# Patient Record
Sex: Male | Born: 1990 | Race: Black or African American | Hispanic: No | Marital: Single | Smoking: Current every day smoker
Health system: Southern US, Community
[De-identification: ages and names within clinical notes are randomized; demographics above are authoritative.]

## PROBLEM LIST (undated history)

## (undated) ENCOUNTER — Emergency Department (HOSPITAL_COMMUNITY): Admission: EM | Payer: No Typology Code available for payment source | Source: Home / Self Care

## (undated) ENCOUNTER — Ambulatory Visit (HOSPITAL_COMMUNITY): Admission: EM | Discharge status: Absent without leave | Payer: No Typology Code available for payment source

## (undated) DIAGNOSIS — IMO0001 Reserved for inherently not codable concepts without codable children: Secondary | ICD-10-CM

## (undated) DIAGNOSIS — J45909 Unspecified asthma, uncomplicated: Secondary | ICD-10-CM

---
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
## 2009-09-13 ENCOUNTER — Emergency Department (HOSPITAL_COMMUNITY): Admission: EM | Admit: 2009-09-13 | Discharge: 2009-09-13 | Payer: Self-pay | Admitting: Emergency Medicine

## 2010-03-16 ENCOUNTER — Emergency Department (HOSPITAL_COMMUNITY): Admission: EM | Admit: 2010-03-16 | Discharge: 2010-03-17 | Payer: Self-pay | Admitting: Emergency Medicine

## 2011-11-05 ENCOUNTER — Encounter (HOSPITAL_COMMUNITY): Payer: Self-pay | Admitting: Emergency Medicine

## 2011-11-05 ENCOUNTER — Emergency Department (HOSPITAL_COMMUNITY)
Admission: EM | Admit: 2011-11-05 | Discharge: 2011-11-06 | Disposition: A | Discharge status: Home / Self Care | Payer: Self-pay | Attending: Emergency Medicine | Admitting: Emergency Medicine

## 2011-11-05 DIAGNOSIS — M542 Cervicalgia: Secondary | ICD-10-CM | POA: Insufficient documentation

## 2011-11-05 DIAGNOSIS — M79605 Pain in left leg: Secondary | ICD-10-CM

## 2011-11-05 DIAGNOSIS — Z79899 Other long term (current) drug therapy: Secondary | ICD-10-CM | POA: Insufficient documentation

## 2011-11-05 DIAGNOSIS — R51 Headache: Secondary | ICD-10-CM | POA: Insufficient documentation

## 2011-11-05 DIAGNOSIS — M79609 Pain in unspecified limb: Secondary | ICD-10-CM | POA: Insufficient documentation

## 2011-11-05 DIAGNOSIS — IMO0001 Reserved for inherently not codable concepts without codable children: Secondary | ICD-10-CM | POA: Insufficient documentation

## 2011-11-05 HISTORY — DX: Reserved for inherently not codable concepts without codable children: IMO0001

## 2011-11-05 MED ORDER — IBUPROFEN 800 MG PO TABS
800.0000 mg | ORAL_TABLET | Freq: Once | ORAL | Status: AC
  Administered 2011-11-05: 800 mg via ORAL

## 2011-11-05 MED ORDER — OXYCODONE-ACETAMINOPHEN 5-325 MG PO TABS
ORAL_TABLET | ORAL | Status: AC
  Filled 2011-11-05: qty 2

## 2011-11-05 MED ORDER — IBUPROFEN 800 MG PO TABS
ORAL_TABLET | ORAL | Status: AC
  Filled 2011-11-05: qty 1

## 2011-11-05 MED ORDER — OXYCODONE-ACETAMINOPHEN 5-325 MG PO TABS
2.0000 | ORAL_TABLET | Freq: Once | ORAL | Status: AC
  Administered 2011-11-05: 2 via ORAL
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
## 2011-11-06 ENCOUNTER — Emergency Department (HOSPITAL_COMMUNITY): Payer: Self-pay

## 2011-11-06 MED ORDER — OXYCODONE-ACETAMINOPHEN 5-325 MG PO TABS: 2.0000 | ORAL_TABLET | ORAL | Status: AC | PRN

## 2011-11-06 MED ORDER — IBUPROFEN 800 MG PO TABS: 800.0000 mg | ORAL_TABLET | Freq: Three times a day (TID) | ORAL | Status: AC

## 2011-11-06 MED ORDER — DIAZEPAM 5 MG PO TABS: 5.0000 mg | ORAL_TABLET | Freq: Three times a day (TID) | ORAL | Status: AC | PRN

## 2011-11-06 NOTE — ED Notes (Signed)
Rx x 3, pt voiced understanding to f/u with [facility] in 3-5 days.
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
## 2014-10-15 ENCOUNTER — Emergency Department (HOSPITAL_COMMUNITY)
Admission: EM | Admit: 2014-10-15 | Discharge: 2014-10-15 | Disposition: A | Discharge status: Home / Self Care | Payer: Self-pay | Attending: Emergency Medicine | Admitting: Emergency Medicine

## 2014-10-15 ENCOUNTER — Emergency Department (HOSPITAL_COMMUNITY): Payer: Self-pay

## 2014-10-15 DIAGNOSIS — Y998 Other external cause status: Secondary | ICD-10-CM | POA: Insufficient documentation

## 2014-10-15 DIAGNOSIS — Y9389 Activity, other specified: Secondary | ICD-10-CM | POA: Insufficient documentation

## 2014-10-15 DIAGNOSIS — S8992XA Unspecified injury of left lower leg, initial encounter: Secondary | ICD-10-CM | POA: Insufficient documentation

## 2014-10-15 DIAGNOSIS — Y92410 Unspecified street and highway as the place of occurrence of the external cause: Secondary | ICD-10-CM | POA: Insufficient documentation

## 2014-10-15 DIAGNOSIS — M25569 Pain in unspecified knee: Secondary | ICD-10-CM

## 2014-10-15 MED ORDER — IBUPROFEN 800 MG PO TABS
800.0000 mg | ORAL_TABLET | Freq: Once | ORAL | Status: AC
  Administered 2014-10-15: 800 mg via ORAL
  Filled 2014-10-15: qty 1
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
## 2014-10-15 NOTE — Discharge Instructions (Signed)
Your x-ray was negative for acute fracture. You may follow-up with orthopedics if continue having problems/pain. Take tylenol or motrin as needed for pain. Return here for new concerns.
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
## 2014-10-17 ENCOUNTER — Emergency Department (HOSPITAL_COMMUNITY): Payer: Self-pay

## 2014-10-17 ENCOUNTER — Encounter (HOSPITAL_COMMUNITY): Payer: Self-pay | Admitting: Emergency Medicine

## 2014-10-17 ENCOUNTER — Emergency Department (HOSPITAL_COMMUNITY)
Admission: EM | Admit: 2014-10-17 | Discharge: 2014-10-17 | Disposition: A | Discharge status: Home / Self Care | Payer: Self-pay | Attending: Emergency Medicine | Admitting: Emergency Medicine

## 2014-10-17 DIAGNOSIS — Y92410 Unspecified street and highway as the place of occurrence of the external cause: Secondary | ICD-10-CM | POA: Insufficient documentation

## 2014-10-17 DIAGNOSIS — S8992XA Unspecified injury of left lower leg, initial encounter: Secondary | ICD-10-CM | POA: Insufficient documentation

## 2014-10-17 DIAGNOSIS — R52 Pain, unspecified: Secondary | ICD-10-CM

## 2014-10-17 DIAGNOSIS — Z720 Tobacco use: Secondary | ICD-10-CM | POA: Insufficient documentation

## 2014-10-17 DIAGNOSIS — M7918 Myalgia, other site: Secondary | ICD-10-CM

## 2014-10-17 DIAGNOSIS — Y9389 Activity, other specified: Secondary | ICD-10-CM | POA: Insufficient documentation

## 2014-10-17 DIAGNOSIS — Y998 Other external cause status: Secondary | ICD-10-CM | POA: Insufficient documentation

## 2014-10-17 DIAGNOSIS — S199XXA Unspecified injury of neck, initial encounter: Secondary | ICD-10-CM | POA: Insufficient documentation

## 2014-10-17 MED ORDER — METHOCARBAMOL 500 MG PO TABS: 1000.0000 mg | ORAL_TABLET | Freq: Four times a day (QID) | ORAL | Status: DC | PRN

## 2014-10-17 MED ORDER — METHOCARBAMOL 500 MG PO TABS
1000.0000 mg | ORAL_TABLET | Freq: Once | ORAL | Status: AC
  Administered 2014-10-17: 1000 mg via ORAL
  Filled 2014-10-17: qty 2

## 2014-10-17 NOTE — Discharge Instructions (Signed)
Rest, Ice intermittently (in the first 24-48 hours), Gentle compression with an Ace wrap, and elevate (Limb above the level of the heart)   For pain control you may take up to 800mg  of Motrin (also known as ibuprofen). That is usually 4 over the counter pills,  3 times a day. Take with food to minimize stomach irritation   You can also take  tylenol (acetaminophen) 975mg  (this is 3 over the counter pills) four times a day. Do not drink alcohol or combine with other medications that have acetaminophen as an ingredient (Read the labels!).    For breakthrough pain you may take Robaxin. Do not drink alcohol, drive or operate heavy machinery when taking Robaxin.
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
## 2017-07-12 ENCOUNTER — Encounter (HOSPITAL_COMMUNITY): Payer: Self-pay | Admitting: Nurse Practitioner

## 2017-07-12 ENCOUNTER — Emergency Department (HOSPITAL_COMMUNITY)
Admission: EM | Admit: 2017-07-12 | Discharge: 2017-07-12 | Disposition: A | Discharge status: Home / Self Care | Payer: No Typology Code available for payment source | Attending: Emergency Medicine | Admitting: Emergency Medicine

## 2017-07-12 ENCOUNTER — Other Ambulatory Visit: Payer: Self-pay

## 2017-07-12 ENCOUNTER — Emergency Department (HOSPITAL_COMMUNITY): Payer: No Typology Code available for payment source

## 2017-07-12 DIAGNOSIS — R51 Headache: Secondary | ICD-10-CM | POA: Insufficient documentation

## 2017-07-12 DIAGNOSIS — R1110 Vomiting, unspecified: Secondary | ICD-10-CM | POA: Diagnosis not present

## 2017-07-12 DIAGNOSIS — J45909 Unspecified asthma, uncomplicated: Secondary | ICD-10-CM | POA: Insufficient documentation

## 2017-07-12 DIAGNOSIS — F17210 Nicotine dependence, cigarettes, uncomplicated: Secondary | ICD-10-CM | POA: Insufficient documentation

## 2017-07-12 DIAGNOSIS — M542 Cervicalgia: Secondary | ICD-10-CM | POA: Insufficient documentation

## 2017-07-12 HISTORY — DX: Unspecified asthma, uncomplicated: J45.909

## 2017-07-12 MED ORDER — NAPROXEN 500 MG PO TABS: 500.0000 mg | ORAL_TABLET | Freq: Two times a day (BID) | ORAL | 0 refills | Status: DC

## 2017-07-12 MED ORDER — NAPROXEN 250 MG PO TABS
500.0000 mg | ORAL_TABLET | Freq: Once | ORAL | Status: AC
  Administered 2017-07-12: 500 mg via ORAL
  Filled 2017-07-12: qty 2

## 2017-07-12 MED ORDER — METHOCARBAMOL 500 MG PO TABS: 500.0000 mg | ORAL_TABLET | Freq: Three times a day (TID) | ORAL | 0 refills | Status: DC | PRN
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
## 2017-07-12 NOTE — ED Notes (Signed)
To ct
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
## 2020-02-24 ENCOUNTER — Other Ambulatory Visit: Payer: Self-pay

## 2020-02-24 ENCOUNTER — Emergency Department (HOSPITAL_COMMUNITY)
Admission: EM | Admit: 2020-02-24 | Discharge: 2020-02-24 | Disposition: A | Discharge status: Home / Self Care | Payer: Self-pay | Attending: Emergency Medicine | Admitting: Emergency Medicine

## 2020-02-24 DIAGNOSIS — K068 Other specified disorders of gingiva and edentulous alveolar ridge: Secondary | ICD-10-CM | POA: Insufficient documentation

## 2020-02-24 DIAGNOSIS — J45909 Unspecified asthma, uncomplicated: Secondary | ICD-10-CM | POA: Insufficient documentation

## 2020-02-24 DIAGNOSIS — F17210 Nicotine dependence, cigarettes, uncomplicated: Secondary | ICD-10-CM | POA: Insufficient documentation

## 2020-02-24 DIAGNOSIS — K0889 Other specified disorders of teeth and supporting structures: Secondary | ICD-10-CM | POA: Insufficient documentation

## 2020-02-24 LAB — CBC
HCT: 50.9 % (ref 39.0–52.0)
Hemoglobin: 16 g/dL (ref 13.0–17.0)
MCH: 28.2 pg (ref 26.0–34.0)
MCHC: 31.4 g/dL (ref 30.0–36.0)
MCV: 89.6 fL (ref 80.0–100.0)
Platelets: 338 10*3/uL (ref 150–400)
RBC: 5.68 MIL/uL (ref 4.22–5.81)
RDW: 13.9 % (ref 11.5–15.5)
WBC: 12.6 10*3/uL — ABNORMAL HIGH (ref 4.0–10.5)
nRBC: 0 % (ref 0.0–0.2)

## 2020-02-24 NOTE — ED Triage Notes (Signed)
Pt reports his R lower gums began to bleed without injury at work two days ago. Now his gums bleed every time he brushes his teeth.

## 2020-02-24 NOTE — ED Notes (Signed)
Patient verbalizes understanding of discharge instructions. Opportunity for questioning and answers were provided. Armband removed by staff, pt discharged from ED. Pt. ambulatory and discharged home.  

## 2020-02-24 NOTE — ED Provider Notes (Signed)
[facility] EMERGENCY DEPARTMENT Provider Note   CSN: [id number] Arrival date & time: 02/24/20  1443     History Chief Complaint  Patient presents with  . Bleeding/Bruising    bleeding gums    Howard Jordan is a 29 y.o. male.  Howard Jordan is a 29 y.o. male with history of, who presents to the ED for evaluation of bleeding gums.  He states over the past few days he has noted that every time he brushes his teeth his gums bleed primarily over the right lower molars, he states that the other day while he was at work his gums began to spontaneously bleed in this area without any trauma or injury and he had not recently brushed his teeth.  His manager asked him to leave work and be evaluated before returning.  He states that his gums are tender after he brushes his teeth, he has never had issues with bleeding gums before.  Denies any other bruising or bleeding problems, and he has never been told he has a problem with clotting.  He has not seen a dentist in quite a while, denies history of dental infections but has had many cavities.  Denies any current bleeding from his gums.  He has not noticed any rashes over his skin.  No arthralgias.  No fevers or chills.  No other aggravating or alleviating factors.        Past Medical History:  Diagnosis Date  . Asthma   . No significant past medical history     Patient Active Problem List   Diagnosis Date Noted  . No significant past medical history     No past surgical history on file.     No family history on file.  Social History   Tobacco Use  . Smoking status: Current Every Day Smoker    Packs/day: 0.50    Types: Cigarettes  . Smokeless tobacco: Never Used  Substance Use Topics  . Alcohol use: Yes    Alcohol/week: 5.0 standard drinks    Types: 5 Cans of beer per week  . Drug use: Yes    Types: Marijuana    Comment: occasionally     Home Medications Prior to Admission medications   Medication Sig  Start Date End Date Taking? Authorizing Provider  methocarbamol (ROBAXIN) 500 MG tablet Take 1 tablet (500 mg total) by mouth every 8 (eight) hours as needed for muscle spasms. 07/12/17   Petrucelli, Samantha R, PA-C  naproxen (NAPROSYN) 500 MG tablet Take 1 tablet (500 mg total) by mouth 2 (two) times daily. 07/12/17   Petrucelli, Pleas Koch, PA-C    Allergies    Patient has no known allergies.  Review of Systems   Review of Systems  Constitutional: Negative for chills and fever.  HENT: Positive for dental problem.        Gums bleeding  Skin: Negative for color change, rash and wound.  Hematological: Does not bruise/bleed easily.    Physical Exam Updated Vital Signs BP (!) 144/81 (BP Location: Right Arm)   Pulse 67   Temp 98.6 F (37 C) (Oral)   Resp 16   SpO2 100%   Physical Exam Vitals and nursing note reviewed.  Constitutional:      General: He is not in acute distress.    Appearance: Normal appearance. He is well-developed. He is not ill-appearing or diaphoretic.  HENT:     Head: Normocephalic and atraumatic.     Mouth/Throat:  Mouth: Mucous membranes are moist.     Pharynx: Oropharynx is clear.     Comments: No bleeding over the gums, teeth with noted plaque buildup, and in poor dentition.  Gums over the right molar without evidence of abscess, but do appear to be somewhat inflamed and are receding compared to the left Eyes:     General:        Right eye: No discharge.        Left eye: No discharge.  Pulmonary:     Effort: Pulmonary effort is normal. No respiratory distress.  Skin:    General: Skin is warm and dry.     Comments: No purpura, petechia or rash noted.  Neurological:     Mental Status: He is alert and oriented to person, place, and time.     Coordination: Coordination normal.  Psychiatric:        Mood and Affect: Mood normal.        Behavior: Behavior normal.     ED Results / Procedures / Treatments   Labs (all labs ordered are listed, but  only abnormal results are displayed) Labs Reviewed  CBC - Abnormal; Notable for the following components:      Result Value   WBC 12.6 (*)    All other components within normal limits    EKG None  Radiology No results found.  Procedures Procedures (including critical care time)  Medications Ordered in ED Medications - No data to display  ED Course  I have reviewed the triage vital signs and the nursing notes.  Pertinent labs & imaging results that were available during my care of the patient were reviewed by me and considered in my medical decision making (see chart for details).    MDM Rules/Calculators/A&P                         29 year old male presents with bleeding and pain over his gums primarily over the right lower molars.  He states symptoms started few days ago when he noticed that after brushing his teeth his gums will begin to bleed, he states he has never had a problem with this before.  He states his gums have been intermittently tender after brushing his teeth.  He states that while he was at work his gums randomly started bleeding without injury, or toothbrushing and his boss told him that he needed to go home and get a doctor's note before returning to work.  Suspect bleeding is likely due to gingivitis and/or periodontal disease, patient does not have any other bleeding or bruising symptoms, no petechiae purpura.  Will check platelets, but if this is normal recommend using softer toothbrush, and seeing a dentist for further evaluation.   Patient's platelets are within normal limits.  I have asked him to use a soft bristle toothbrush, mouthwash to help with gingivitis and I have given him a referral and resources for dental follow-up.  Provided work note. At this time there does not appear to be any evidence of an acute emergency medical condition and the patient appears stable for discharge with appropriate outpatient follow up. Diagnosis was discussed with patient  who verbalizes understanding and is agreeable to discharge.     Final Clinical Impression(s) / ED Diagnoses Final diagnoses:  Bleeding gums    Rx / DC Orders ED Discharge Orders    None       Legrand Rams 02/24/20 2054    Hyacinth Meeker,  Arlys John, MD 02/29/20 1302

## 2020-02-24 NOTE — Discharge Instructions (Signed)
Your platelets are normal today. You should make sure you're using a soft bristle toothbrush and brushing gently, use a mouthwash twice daily to help with potential gingivitis or inflammation of your gums. You should follow-up with the dentist. If you do start having bleeding of your gums swish and spit with cold water to help with bleeding.

## 2020-09-02 ENCOUNTER — Ambulatory Visit (HOSPITAL_COMMUNITY)
Admission: EM | Admit: 2020-09-02 | Discharge: 2020-09-02 | Disposition: A | Discharge status: Home / Self Care | Payer: No Typology Code available for payment source | Attending: Family Medicine | Admitting: Family Medicine

## 2020-09-02 ENCOUNTER — Other Ambulatory Visit: Payer: Self-pay

## 2020-09-02 ENCOUNTER — Encounter (HOSPITAL_COMMUNITY): Payer: Self-pay

## 2020-09-02 DIAGNOSIS — M109 Gout, unspecified: Secondary | ICD-10-CM

## 2020-09-02 MED ORDER — INDOMETHACIN 50 MG PO CAPS: 50.0000 mg | ORAL_CAPSULE | Freq: Three times a day (TID) | ORAL | 0 refills | Status: DC

## 2020-09-02 NOTE — ED Triage Notes (Signed)
Pt presents with pain and swelling left big toe x 2 days. Denies injury.

## 2020-09-05 ENCOUNTER — Other Ambulatory Visit: Payer: Self-pay

## 2020-09-05 ENCOUNTER — Ambulatory Visit (HOSPITAL_COMMUNITY)
Admission: EM | Admit: 2020-09-05 | Discharge: 2020-09-05 | Disposition: A | Discharge status: Home / Self Care | Payer: No Typology Code available for payment source | Attending: Student | Admitting: Student

## 2020-09-05 ENCOUNTER — Encounter (HOSPITAL_COMMUNITY): Payer: Self-pay

## 2020-09-05 DIAGNOSIS — M10072 Idiopathic gout, left ankle and foot: Secondary | ICD-10-CM

## 2020-09-05 MED ORDER — METHYLPREDNISOLONE SODIUM SUCC 125 MG IJ SOLR
60.0000 mg | Freq: Once | INTRAMUSCULAR | Status: AC
  Administered 2020-09-05: 60 mg via INTRAMUSCULAR

## 2020-09-05 MED ORDER — METHYLPREDNISOLONE SODIUM SUCC 125 MG IJ SOLR
INTRAMUSCULAR | Status: AC
  Filled 2020-09-05: qty 2

## 2020-09-05 NOTE — ED Provider Notes (Signed)
  [facility]ARE [facility]ENTER   [id number] 09/02/20 Arrival Time: 1719  ASSESSMENT & PLAN:  1. Podagra     No indications for imaging. No sign of skin infection. Discussed gout.  Begin: Meds ordered this encounter  Medications  . indomethacin (INDOCIN) 50 MG capsule    Sig: Take 1 capsule (50 mg total) by mouth 3 (three) times daily with meals.    Dispense:  15 capsule    Refill:  0   Activities as tolerated.  Recommend:  Follow-up Information    [facility].   Specialty: Urgent Care Why: If worsening or failing to improve as anticipated. Contact information: [address] [place] [address] [phone]              Reviewed expectations re: course of current medical issues. Questions answered. Outlined signs and symptoms indicating need for more acute intervention. Patient verbalized understanding. After Visit Summary given.  SUBJECTIVE: History from: patient. Howard Jordan is a 30 y.o. male who reports persistent and significant pain of L great toe; grad onset; 2 d. "Everything I do is painful". Mild redness. No injury.  Social History   Substance and Sexual Activity  Alcohol Use Yes  . Alcohol/week: 24.0 standard drinks  . Types: 24 Cans of beer per week   Comment: 24 can a week.      OBJECTIVE:  Vitals:   09/02/20 1814  BP: 138/79  Pulse: 60  Resp: 18  Temp: 99 F (37.2 C)  TempSrc: Oral  SpO2: 98%    General appearance: alert; no distress HEENT: [place]; AT Neck: supple with FROM Resp: unlabored respirations Extremities: . LLE: warm with well perfused appearance; TTP over 1st MTP joint with mild swelling and redness and warmth; normal ROM with pain; normal distal cap refill and sensation Skin: warm and dry; no visible rashes Psychological: alert and cooperative; normal mood and affect   No Known Allergies  Past Medical History:  Diagnosis Date  . Asthma   . No significant past medical history     Social History   Socioeconomic History  . Marital status: Single    Spouse name: Not on file  . Number of children: Not on file  . Years of education: Not on file  . Highest education level: Not on file  Occupational History  . Not on file  Tobacco Use  . Smoking status: Current Every Day Smoker    Packs/day: 0.50    Types: Cigarettes  . Smokeless tobacco: Never Used  Substance and Sexual Activity  . Alcohol use: Yes    Alcohol/week: 24.0 standard drinks    Types: 24 Cans of beer per week    Comment: 24 can a week.   . Drug use: Yes    Frequency: 3.0 times per week    Types: Marijuana    Comment: occasionally   . Sexual activity: Yes  Other Topics Concern  . Not on file  Social History Narrative  . Not on file   Social Determinants of Health   Financial Resource Strain: Not on file  Food Insecurity: Not on file  Transportation Needs: Not on file  Physical Activity: Not on file  Stress: Not on file  Social Connections: Not on file   History reviewed. No pertinent family history. History reviewed. No pertinent surgical history.    Mardella Layman, MD 09/05/20 [phone]

## 2020-09-05 NOTE — Discharge Instructions (Addendum)
Today is 09/05/2020. Howard Jordan, DOB 07/09/1991. You are being treated for gout of the left great toe.  Start the prednisone- 40mg  (two pills) taken together in the morning for 5 days (3/13-17) . Also continue indomethacin 50mg  3x daily with meals for 2-3 more days (3/12-15).  We also administered an injection of methylprednisolone (solumedrol) today, which is a steroid.

## 2020-09-05 NOTE — ED Provider Notes (Signed)
[facility]    CSN: [id number] Arrival date & time: 09/05/20  1453      History   Chief Complaint Chief Complaint  Patient presents with  . Toe Pain    Left foot big     HPI Howard Jordan is a 30 y.o. male presenting with continued left great toe pain. History gout, last treated for this 09/02/2020 at our urgent care with indomethacin 3x daily. Notes minimal improvement in pain on this regimen. Denies new trauma or pain elsewhere. 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  HPI  Past Medical History:  Diagnosis Date  . Asthma   . No significant past medical history     Patient Active Problem List   Diagnosis Date Noted  . No significant past medical history     History reviewed. No pertinent surgical history.     Home Medications    Prior to Admission medications   Medication Sig Start Date End Date Taking? Authorizing Provider  indomethacin (INDOCIN) 50 MG capsule Take 1 capsule (50 mg total) by mouth 3 (three) times daily with meals. 09/02/20   Mardella Layman, MD  methocarbamol (ROBAXIN) 500 MG tablet Take 1 tablet (500 mg total) by mouth every 8 (eight) hours as needed for muscle spasms. 07/12/17   Petrucelli, Pleas Koch, PA-C    Family History History reviewed. No pertinent family history.  Social History Social History   Tobacco Use  . Smoking status: Current Every Day Smoker    Packs/day: 0.50    Types: Cigarettes  . Smokeless tobacco: Never Used  Substance Use Topics  . Alcohol use: Yes    Alcohol/week: 24.0 standard drinks    Types: 24 Cans of beer per week    Comment: 24 can a week.   . Drug use: Yes    Frequency: 3.0 times per week    Types: Marijuana    Comment: occasionally      Allergies   Patient has no known allergies.   Review of Systems Review of Systems  Musculoskeletal:       Left great toe pain  All other systems reviewed and are negative.    Physical Exam Triage Vital Signs ED Triage  Vitals  Enc Vitals Group     BP      Pulse      Resp      Temp      Temp src      SpO2      Weight      Height      Head Circumference      Peak Flow      Pain Score      Pain Loc      Pain Edu?      Excl. in GC?    No data found.  Updated Vital Signs BP (!) 137/91 (BP Location: Right Arm)   Pulse 61   Temp 98.2 F (36.8 C) (Oral)   Resp 16   SpO2 99%   Visual Acuity Right Eye Distance:   Left Eye Distance:   Bilateral Distance:    Right Eye Near:   Left Eye Near:    Bilateral Near:     Physical Exam Vitals reviewed.  Constitutional:      Appearance: Normal appearance.  HENT:     Head: Normocephalic and atraumatic.  Pulmonary:     Effort: Pulmonary effort is normal.  Musculoskeletal:     Comments: Left great toe  tender to palpation over MTP joint with mild swelling and redness.  No warmth.  ROM intact but with pain.Cap refil <2 seconds. Sensation intact.  Neurological:     General: No focal deficit present.     Mental Status: He is alert and oriented to person, place, and time.  Psychiatric:        Mood and Affect: Mood normal.        Behavior: Behavior normal.        Thought Content: Thought content normal.        Judgment: Judgment normal.      UC Treatments / Results  Labs (all labs ordered are listed, but only abnormal results are displayed) Labs Reviewed - No data to display  EKG   Radiology No results found.  Procedures Procedures (including critical care time)  Medications Ordered in UC Medications  methylPREDNISolone sodium succinate (SOLU-MEDROL) 125 mg/2 mL injection 60 mg (has no administration in time range)    Initial Impression / Assessment and Plan / UC Course  I have reviewed the triage vital signs and the nursing notes.  Pertinent labs & imaging results that were available during my care of the patient were reviewed by me and considered in my medical decision making (see chart for details).     This patient is a  30 year old male presenting with continued gout symptoms despite 3 days of indomethacin tid. Plan to treat with solumedrol today and prednisone PO starting tomorrow. He is not a diabetic. Return precautions discussed.   This chart was dictated using voice recognition software, Dragon. Despite the best efforts of this provider to proofread and correct errors, errors may still occur which can change documentation meaning.   Final Clinical Impressions(s) / UC Diagnoses   Final diagnoses:  Acute idiopathic gout involving toe of left foot     Discharge Instructions     Today is 09/05/2020. Howard Jordan, DOB 07/09/1991. You are being treated for gout of the left great toe.  Start the prednisone- 40mg  (two pills) taken together in the morning for 5 days (3/13-17) . Also continue indomethacin 50mg  3x daily with meals for 2-3 more days (3/12-15).  We also administered an injection of methylprednisolone (solumedrol) today, which is a steroid.     ED Prescriptions    None     PDMP not reviewed this encounter.   , PA-C 09/05/20 1547

## 2020-09-05 NOTE — ED Triage Notes (Signed)
Pt present Left foot/Big toe pain with some swelling. Pt states recently came in on 09/02/20 and  He is still limping with severe pain.

## 2021-02-25 ENCOUNTER — Emergency Department (HOSPITAL_BASED_OUTPATIENT_CLINIC_OR_DEPARTMENT_OTHER)
Admission: EM | Admit: 2021-02-25 | Discharge: 2021-02-25 | Disposition: A | Discharge status: Home / Self Care | Payer: No Typology Code available for payment source | Attending: Emergency Medicine | Admitting: Emergency Medicine

## 2021-02-25 ENCOUNTER — Other Ambulatory Visit: Payer: Self-pay

## 2021-02-25 ENCOUNTER — Encounter (HOSPITAL_BASED_OUTPATIENT_CLINIC_OR_DEPARTMENT_OTHER): Payer: Self-pay

## 2021-02-25 DIAGNOSIS — R030 Elevated blood-pressure reading, without diagnosis of hypertension: Secondary | ICD-10-CM | POA: Diagnosis not present

## 2021-02-25 DIAGNOSIS — F17210 Nicotine dependence, cigarettes, uncomplicated: Secondary | ICD-10-CM | POA: Insufficient documentation

## 2021-02-25 DIAGNOSIS — J45909 Unspecified asthma, uncomplicated: Secondary | ICD-10-CM | POA: Insufficient documentation

## 2021-02-25 DIAGNOSIS — K0889 Other specified disorders of teeth and supporting structures: Secondary | ICD-10-CM | POA: Insufficient documentation

## 2021-02-25 DIAGNOSIS — L03211 Cellulitis of face: Secondary | ICD-10-CM | POA: Diagnosis not present

## 2021-02-25 MED ORDER — HYDROCODONE-ACETAMINOPHEN 5-325 MG PO TABS: 1.0000 | ORAL_TABLET | ORAL | 0 refills | Status: DC | PRN

## 2021-02-25 MED ORDER — IBUPROFEN 800 MG PO TABS
800.0000 mg | ORAL_TABLET | Freq: Once | ORAL | Status: AC
  Administered 2021-02-25: 800 mg via ORAL
  Filled 2021-02-25: qty 1

## 2021-02-25 MED ORDER — AMOXICILLIN 500 MG PO CAPS
1000.0000 mg | ORAL_CAPSULE | Freq: Once | ORAL | Status: AC
  Administered 2021-02-25: 1000 mg via ORAL
  Filled 2021-02-25: qty 2

## 2021-02-25 MED ORDER — AMOXICILLIN 500 MG PO CAPS: 1000.0000 mg | ORAL_CAPSULE | Freq: Two times a day (BID) | ORAL | 0 refills | Status: DC

## 2021-02-25 NOTE — ED Provider Notes (Signed)
[facility] EMERGENCY DEPT Provider Note   CSN: [id number] Arrival date & time: 02/25/21  7867     History Chief Complaint  Patient presents with   Dental Pain   Facial Swelling    Howard Jordan is a 30 y.o. male.  The history is provided by the patient.  Dental Pain He has history of asthma and comes in complaining of pain and swelling on the right side of his face which started this morning.  He has a long history of dental pain but has not been able to see a dentist because of finances.  He denies fever or chills.  He has not tried anything for pain.   Past Medical History:  Diagnosis Date   Asthma    No significant past medical history     Patient Active Problem List   Diagnosis Date Noted   No significant past medical history     History reviewed. No pertinent surgical history.     No family history on file.  Social History   Tobacco Use   Smoking status: Every Day    Packs/day: 0.50    Types: Cigarettes   Smokeless tobacco: Never  Substance Use Topics   Alcohol use: Yes    Alcohol/week: 24.0 standard drinks    Types: 24 Cans of beer per week    Comment: 1 Beer/Day   Drug use: Yes    Frequency: 7.0 times per week    Types: Marijuana    Comment: Daily    Home Medications Prior to Admission medications   Medication Sig Start Date End Date Taking? Authorizing Provider  indomethacin (INDOCIN) 50 MG capsule Take 1 capsule (50 mg total) by mouth 3 (three) times daily with meals. 09/02/20   Mardella Layman, MD  methocarbamol (ROBAXIN) 500 MG tablet Take 1 tablet (500 mg total) by mouth every 8 (eight) hours as needed for muscle spasms. 07/12/17   Petrucelli, Pleas Koch, PA-C    Allergies    Patient has no known allergies.  Review of Systems   Review of Systems  All other systems reviewed and are negative.  Physical Exam Updated Vital Signs BP (!) 158/104 (BP Location: Right Arm)   Pulse 75   Temp 98.4 F (36.9 C) (Oral)   Resp 16    Ht 6' (1.829 m)   Wt 106.6 kg   SpO2 99%   BMI 31.87 kg/m   Physical Exam Vitals and nursing note reviewed.  30 year old male, resting comfortably and in no acute distress. Vital signs are significant for elevated blood pressure. Oxygen saturation is 99%, which is normal. Head is normocephalic and atraumatic. PERRLA, EOMI. there is soft tissue swelling of the right malar area with mild tenderness.  There is no erythema or fluctuance.  Examination of the oral cavity shows no obvious dental caries, no dental abscesses.  There is no tenderness to palpation of the gingiva and no tenderness to percussion on any of the teeth. Neck is nontender and supple without adenopathy or JVD. Back is nontender and there is no CVA tenderness. Lungs are clear without rales, wheezes, or rhonchi. Chest is nontender. Heart has regular rate and rhythm without murmur. Abdomen is soft, flat, nontender without masses or hepatosplenomegaly and peristalsis is normoactive. Extremities have no cyanosis or edema, full range of motion is present. Skin is warm and dry without rash. Neurologic: Mental status is normal, cranial nerves are intact, there are no motor or sensory deficits.  ED Results / Procedures /  Treatments   Labs  Procedures Procedures   Medications Ordered in ED Medications  amoxicillin (AMOXIL) capsule 1,000 mg (1,000 mg Oral Given 02/25/21 0202)  ibuprofen (ADVIL) tablet 800 mg (800 mg Oral Given 02/25/21 0202)    ED Course  I have reviewed the triage vital signs and the nursing notes.  MDM Rules/Calculators/A&P                         Facial cellulitis without clear evidence of abscess at this time.  Elevated blood pressure.  He is discharged with prescription for amoxicillin and also small number of hydrocodone-acetaminophen tablets.  He is referred to ENT and on-call dentist for follow-up.  Old records are reviewed, and he has no relevant past visits.  Final Clinical Impression(s) / ED  Diagnoses Final diagnoses:  Facial cellulitis  Elevated blood pressure reading without diagnosis of hypertension    Rx / DC Orders ED Discharge Orders          Ordered    amoxicillin (AMOXIL) 500 MG capsule  2 times daily        02/25/21 0209    HYDROcodone-acetaminophen (NORCO) 5-325 MG tablet  Every 4 hours PRN        02/25/21 0209             Dione Booze, MD 02/25/21 0210

## 2021-02-25 NOTE — Discharge Instructions (Addendum)
Apply warm compresses several times a day.  Take ibuprofen and/your acetaminophen as needed for pain.  Please note that combining the 2 medications gives better pain relief than either medication by itself.  Reserve hydrocodone-acetaminophen for severe pain..  Your blood sugar was high today.  It may have been high because you are in pain, but you also may have high blood pressure.  Please have your blood pressure rechecked several times in the next 2 weeks.  If it is staying high, you may need to be on medication to control it.  Inadequately controlled high blood pressure can lead to heart attacks, strokes, kidney failure.

## 2021-02-25 NOTE — ED Triage Notes (Signed)
Patient here POV from Home with Dental Pain.  Patient states he has been having Right Sided Dental Pain for approximately 5-10 years but never went to see Dentist due to Lack of Dental Insurance.  Today the Patient noticed Moderate Swelling to the Right Side of his Face.   Airway Intact. No Fevers at Home. A&Ox4. GCS 15. NAD Noted during Triage.

## 2022-06-22 ENCOUNTER — Ambulatory Visit (HOSPITAL_COMMUNITY)
Admission: EM | Admit: 2022-06-22 | Discharge: 2022-06-22 | Disposition: A | Discharge status: Home / Self Care | Payer: Self-pay | Attending: Urgent Care | Admitting: Urgent Care

## 2022-06-22 ENCOUNTER — Encounter (HOSPITAL_COMMUNITY): Payer: Self-pay

## 2022-06-22 DIAGNOSIS — M2142 Flat foot [pes planus] (acquired), left foot: Secondary | ICD-10-CM

## 2022-06-22 DIAGNOSIS — M2141 Flat foot [pes planus] (acquired), right foot: Secondary | ICD-10-CM

## 2022-06-22 DIAGNOSIS — M25572 Pain in left ankle and joints of left foot: Secondary | ICD-10-CM

## 2022-06-22 MED ORDER — DICLOFENAC SODIUM 75 MG PO TBEC: 75.0000 mg | DELAYED_RELEASE_TABLET | Freq: Two times a day (BID) | ORAL | 0 refills | Status: AC

## 2022-06-22 NOTE — Discharge Instructions (Addendum)
It is possible that you have a partial sprain to your deltoid ligament on your left ankle. Please take the diclofenac twice daily with food. Do not take any additional over-the-counter anti-inflammatory such as ibuprofen, Aleve, naproxen, Advil. Please perform the stretches attached to this handout.  For your flatfeet, please purchase Dr. Margart Sickles metatarsal arch supports.  This will help prevent foot issues in the future.

## 2022-06-22 NOTE — ED Triage Notes (Signed)
Pt is here for ankle pain since today causing pain and discomfort

## 2022-06-22 NOTE — ED Provider Notes (Signed)
[facility]    CSN: [id number] Arrival date & time: 06/22/22  1801      History   Chief Complaint Chief Complaint  Patient presents with   Ankle Pain    HPI Howard Jordan is a 31 y.o. male.   Pleasant 31 year old male presents today due to concerns of left ankle pain.  He states yesterday evening he started noticing a "twinge of pain", but states he woke up this morning with worse pain.  He states this morning he was unable to bear weight and it hurt to move his ankle.  He was unable to work.  He did take 2 over-the-counter ibuprofens midmorning, and states by mid afternoon the symptoms had improved, but are still there.  He does have a history of gout, but states that was always in his toe and this feels completely different.  He denies any injury or trauma.  He denies any swelling warmth or erythema.  Patient states the pain seems to be in 1 specific location to his medial left ankle around the deltoid ligament.   Ankle Pain   Past Medical History:  Diagnosis Date   Asthma    No significant past medical history     Patient Active Problem List   Diagnosis Date Noted   No significant past medical history     History reviewed. No pertinent surgical history.     Home Medications    Prior to Admission medications   Medication Sig Start Date End Date Taking? Authorizing Provider  diclofenac (VOLTAREN) 75 MG EC tablet Take 1 tablet (75 mg total) by mouth 2 (two) times daily with a meal. 06/22/22  Yes Aleila Syverson L, PA    Family History History reviewed. No pertinent family history.  Social History Social History   Tobacco Use   Smoking status: Every Day    Packs/day: 0.50    Types: Cigarettes   Smokeless tobacco: Never  Substance Use Topics   Alcohol use: Yes    Alcohol/week: 24.0 standard drinks of alcohol    Types: 24 Cans of beer per week    Comment: 1 Beer/Day   Drug use: Yes    Frequency: 7.0 times per week    Types: Marijuana     Comment: Daily     Allergies   Patient has no known allergies.   Review of Systems Review of Systems As per HPI  Physical Exam Triage Vital Signs ED Triage Vitals  Enc Vitals Group     BP 06/22/22 2005 124/87     Pulse Rate 06/22/22 2005 91     Resp 06/22/22 2005 12     Temp 06/22/22 2005 98.2 F (36.8 C)     Temp Source 06/22/22 2005 Oral     SpO2 06/22/22 2005 97 %     Weight --      Height --      Head Circumference --      Peak Flow --      Pain Score 06/22/22 2004 8     Pain Loc --      Pain Edu? --      Excl. in GC? --    No data found.  Updated Vital Signs BP 124/87 (BP Location: Left Arm)   Pulse 91   Temp 98.2 F (36.8 C) (Oral)   Resp 12   SpO2 97%   Visual Acuity Right Eye Distance:   Left Eye Distance:   Bilateral Distance:    Right Eye Near:  Left Eye Near:    Bilateral Near:     Physical Exam Vitals and nursing note reviewed.  Constitutional:      General: He is not in acute distress.    Appearance: Normal appearance. He is normal weight. He is not ill-appearing or toxic-appearing.  HENT:     Head: Normocephalic and atraumatic.  Cardiovascular:     Rate and Rhythm: Normal rate.  Pulmonary:     Effort: Pulmonary effort is normal. No respiratory distress.  Musculoskeletal:     Right ankle: Normal. No swelling, deformity, ecchymosis or lacerations. No tenderness. Normal range of motion. Anterior drawer test negative. Normal pulse.     Right Achilles Tendon: Normal. No tenderness or defects. Thompson's test negative.     Left ankle: Normal. No swelling, deformity, ecchymosis or lacerations. No tenderness. Normal range of motion. Anterior drawer test negative. Normal pulse.     Left Achilles Tendon: Normal. No tenderness or defects. Thompson's test negative.     Right foot: Normal. Normal range of motion and normal capillary refill. No swelling, deformity, tenderness, bony tenderness or crepitus. Normal pulse.     Left foot: Normal range  of motion and normal capillary refill. Tenderness (to a single point on L deltoid ligament) present. No swelling, deformity, bony tenderness or crepitus. Normal pulse.       Feet:     Comments: B pes planus  Skin:    General: Skin is warm and dry.     Capillary Refill: Capillary refill takes less than 2 seconds.     Coloration: Skin is not jaundiced.     Findings: No bruising, erythema or rash.  Neurological:     General: No focal deficit present.     Mental Status: He is alert and oriented to person, place, and time.     Sensory: No sensory deficit.     Motor: No weakness.      UC Treatments / Results  Labs (all labs ordered are listed, but only abnormal results are displayed) Labs Reviewed - No data to display  EKG   Radiology No results found.  Procedures Procedures (including critical care time)  Medications Ordered in UC Medications - No data to display  Initial Impression / Assessment and Plan / UC Course  I have reviewed the triage vital signs and the nursing notes.  Pertinent labs & imaging results that were available during my care of the patient were reviewed by me and considered in my medical decision making (see chart for details).     Pes planus -patient with severe pes planus.  Question if this is predisposing him to his ankle issue.  Recommended a shoe insert, follow-up with podiatry should symptoms persist. Acute left ankle pain -his pain is reproducible to palpation of 1 specific area of his deltoid ligament.  This is likely a mild sprain.  He did have improvement with ibuprofen.  We will discharge him home on diclofenac to take twice daily as needed with food.  Given absence of injury and lack of bony discomfort, imaging unnecessary.  Final Clinical Impressions(s) / UC Diagnoses   Final diagnoses:  Pes planus of both feet  Acute left ankle pain     Discharge Instructions      It is possible that you have a partial sprain to your deltoid  ligament on your left ankle. Please take the diclofenac twice daily with food. Do not take any additional over-the-counter anti-inflammatory such as ibuprofen, Aleve, naproxen, Advil. Please perform the  stretches attached to this handout.  For your flatfeet, please purchase Dr. Margart Sickles metatarsal arch supports.  This will help prevent foot issues in the future.    ED Prescriptions     Medication Sig Dispense Auth. Provider   diclofenac (VOLTAREN) 75 MG EC tablet Take 1 tablet (75 mg total) by mouth 2 (two) times daily with a meal. 20 tablet Aurielle Slingerland L, PA      PDMP not reviewed this encounter.   Maretta Bees, [place] 06/22/22 2154

## 2024-06-30 ENCOUNTER — Encounter (HOSPITAL_COMMUNITY): Payer: Self-pay

## 2024-06-30 ENCOUNTER — Other Ambulatory Visit: Payer: Self-pay

## 2024-06-30 ENCOUNTER — Emergency Department (HOSPITAL_COMMUNITY)
Admission: EM | Admit: 2024-06-30 | Discharge: 2024-06-30 | Disposition: A | Discharge status: Home / Self Care | Attending: Emergency Medicine | Admitting: Emergency Medicine

## 2024-06-30 DIAGNOSIS — K047 Periapical abscess without sinus: Secondary | ICD-10-CM | POA: Insufficient documentation

## 2024-06-30 DIAGNOSIS — K0889 Other specified disorders of teeth and supporting structures: Secondary | ICD-10-CM | POA: Diagnosis present

## 2024-06-30 MED ORDER — AMOXICILLIN-POT CLAVULANATE 875-125 MG PO TABS: 1.0000 | ORAL_TABLET | Freq: Two times a day (BID) | ORAL | 0 refills | Status: AC

## 2024-06-30 MED ORDER — AMOXICILLIN-POT CLAVULANATE 875-125 MG PO TABS
1.0000 | ORAL_TABLET | Freq: Once | ORAL | Status: AC
  Administered 2024-06-30: 1 via ORAL
  Filled 2024-06-30: qty 1

## 2024-06-30 MED ORDER — IBUPROFEN 400 MG PO TABS
600.0000 mg | ORAL_TABLET | Freq: Once | ORAL | Status: AC
  Administered 2024-06-30: 600 mg via ORAL
  Filled 2024-06-30: qty 1

## 2024-06-30 NOTE — ED Provider Notes (Signed)
 " [place] [facility] Provider Note   CSN: [id number] Arrival date & time: 06/30/24  1337     Patient presents with: Dental Pain and Facial Swelling   Howard Jordan is a 34 y.o. male with no significant past medical history presents with concern for right sided dental pain that started last night.  Also reports he has noticed some swelling to his right cheek that started yesterday.  Denies any difficulties with swallowing.  No recent dental work.  Denies fever or chills.  States he has not seen a dentist in many years.    Dental Pain Associated symptoms: no fever        Prior to Admission medications  Medication Sig Start Date End Date Taking? Authorizing Provider  amoxicillin -clavulanate (AUGMENTIN ) 875-125 MG tablet Take 1 tablet by mouth 2 (two) times daily. 06/30/24  Yes Veta Palma, PA-C  diclofenac  (VOLTAREN ) 75 MG EC tablet Take 1 tablet (75 mg total) by mouth 2 (two) times daily with a meal. 06/22/22   Crain, Whitney L, PA    Allergies: Patient has no known allergies.    Review of Systems  Constitutional:  Negative for fever.  HENT:  Positive for dental problem.     Updated Vital Signs BP (!) 142/90   Pulse 60   Temp 99 F (37.2 C)   Resp 18   SpO2 100%   Physical Exam Vitals and nursing note reviewed.  Constitutional:      Appearance: Normal appearance.  HENT:     Head: Atraumatic.     Mouth/Throat:     Comments: Able to open mouth fully, no trismus.  Uvula midline.  Very poor dentition throughout the oral cavity and multiple dental caries noted.  No peritonsillar abscess.  No dental abscesses noted.  No tonsillar exudate  No edema or erythema of the sublingual space.  Swelling to the soft tissue overlying the right cheek.  No erythema.   Submandibular lymphadenopathy bilaterally Neck:     Comments: Neck is soft and supple, no overlying skin change. Cardiovascular:     Rate and Rhythm: Normal rate and regular  rhythm.  Pulmonary:     Effort: Pulmonary effort is normal.  Musculoskeletal:     Cervical back: Normal range of motion. No rigidity.  Neurological:     General: No focal deficit present.     Mental Status: He is alert.  Psychiatric:        Mood and Affect: Mood normal.        Behavior: Behavior normal.     (all labs ordered are listed, but only abnormal results are displayed) Labs Reviewed - No data to display  EKG: None  Radiology: No results found.   Procedures   Medications Ordered in the ED  amoxicillin -clavulanate (AUGMENTIN ) 875-125 MG per tablet 1 tablet (1 tablet Oral Given 06/30/24 1416)  ibuprofen  (ADVIL ) tablet 600 mg (600 mg Oral Given 06/30/24 1416)                                    Medical Decision Making    Differential diagnosis includes but is not limited to dental abscess or infection, ludwig angina, osteomyelitis, peritonsillar abscess, gingivitis, dental carries, sialadenitis   ED Course:  Upon initial evaluation, patient is well appearing in no acute distress.  Stable vitals aside from slightly elevated blood pressure 142/90.  Afebrile.  No tachycardia. Patient able  to open mouth fully with no trismus. Swallowing secretions without difficulty. Uvula midline. No visualized peritonsillar abscess or dental abscess. Neck is soft and supple without overlying skin change, no edema of the submandibular space or tongue, low concern for ludwig angina.  Low concern for deep space infection.  Patient does have soft tissue edema over the right cheek.  No erythema.  He has poor dentition throughout the oral cavity with multiple dental caries noted.  No obvious source of infection noted, but given the poor dentition and sudden onset of swelling and pain, will treat for possible dental infection with course of Augmentin .  Patient stable and appropriate for discharge home at this time  Medications Given: Augmentin  Ibuprofen   Impression: Dental pain Dental  infection  Disposition:  The patient was discharged home with instructions to take tylenol  and ibuprofen  as needed for pain.  Take 7-day course of Augmentin  as prescribed.  Follow up with their dentist within the next 3 days for recheck of symptoms and further routine care.  List of local dental offices was provided for patient if he does not currently have dentist Return precautions given and patient verbalized understanding.  This chart was dictated using voice recognition software, Dragon. Despite the best efforts of this provider to proofread and correct errors, errors may still occur which can change documentation meaning.       Final diagnoses:  Dental infection  Pain, dental    ED Discharge Orders          Ordered    amoxicillin -clavulanate (AUGMENTIN ) 875-125 MG tablet  2 times daily        06/30/24 1433               Veta Palma, PA-C 06/30/24 1438    Melvenia Motto, MD 06/30/24 2145  "

## 2024-06-30 NOTE — Discharge Instructions (Signed)
 You have been seen today for your dental pain  Medications: You may use up to 600mg  ibuprofen  every 6 hours as needed for pain.  Do not exceed 2.4g of ibuprofen  per day.  You were given your first dose here today, next dose will be at 8:15 PM.   You may take up to 1000mg  of tylenol  every 6 hours as needed for pain. Do not take more then 4g per day.   You have been prescribed an antibiotic called Augmentin  (amoxicillin -clavulanate). Take this antibiotic 2 times a day for the next 7 days.  You were given your first dose here today.  You can take your next dose this evening with dinner.  Take the full course of your antibiotic even if you start feeling better. Antibiotics may cause you to have diarrhea.  Follow up: Please make an appointment with your dentist within the next 2-3 days for repeat evaluation and further management.   If you do not have a dentist, you can establish care with one of the offices below: [facility] dental clinic -  [address] [place] - [place] [address] [phone]  [facility] clinic) treats patients who are in the following groups:  Pediatrics with medicaid up to age 34 Patients over 75 with an orange card and a referral from a medical provider.  - If they are referred - there is a 40$ copay for any service - extration etc.  To get an orange card the following process is necessary.  Call the [facility] to apply for card - [phone] Once approved, you the patient will be set up with a medical provider Medical provider will refer patient to the [facility] clinic .  Offices accepting Medicaid patients:  [facility] - [address] [place], [place] [address] [phone]  [facility] - [address] [place] [address] [phone]  [facility] (will see emergency dental patients from ED) - [address] [place], [place] [address] [phone]  Return to the ER if you have difficulty breathing,  difficulty swallowing, severe mouth or jaw pain, you cannot open your mouth, you develop fever or chills, you have any other new or concerning symptoms

## 2024-06-30 NOTE — ED Triage Notes (Signed)
 PT arrives via POV. Pt reports right upper dental pain since yesterday. Pt states he noticed some facial swelling today. Reports he might have a dental abscess.
